# Patient Record
Sex: Male | Born: 1995 | Race: Black or African American | Hispanic: No | Marital: Single | Smoking: Never smoker
Health system: Southern US, Community
[De-identification: ages and names within clinical notes are randomized; demographics above are authoritative.]

## PROBLEM LIST (undated history)

## (undated) DIAGNOSIS — N4830 Priapism, unspecified: Secondary | ICD-10-CM

## (undated) DIAGNOSIS — D571 Sickle-cell disease without crisis: Secondary | ICD-10-CM

## (undated) DIAGNOSIS — J45909 Unspecified asthma, uncomplicated: Secondary | ICD-10-CM

## (undated) HISTORY — PX: SPLENECTOMY: SUR1306

---
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
## 2017-02-23 DEATH — deceased

## 2018-05-03 IMAGING — DX DG LUMBAR SPINE 2-3V
3 series · 3 of 3 positions shown · non-contrast
Comparison: None.

CLINICAL DATA: Sickle cell crisis.  Low back pain.

EXAM:
LUMBAR SPINE - 2-3 VIEW

[l-spine ap]
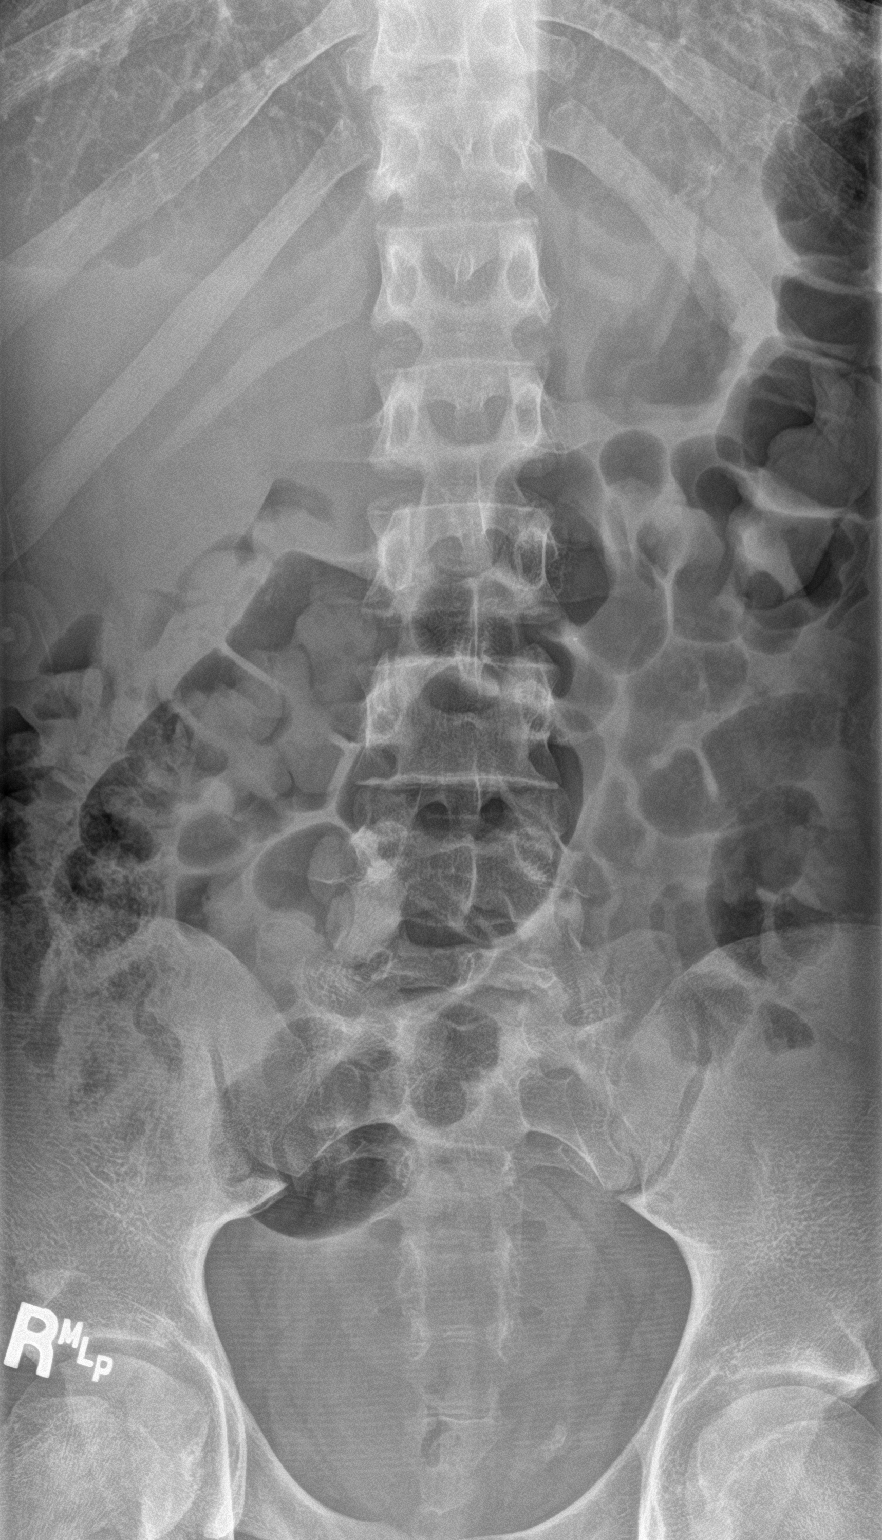

[l-spine lat]
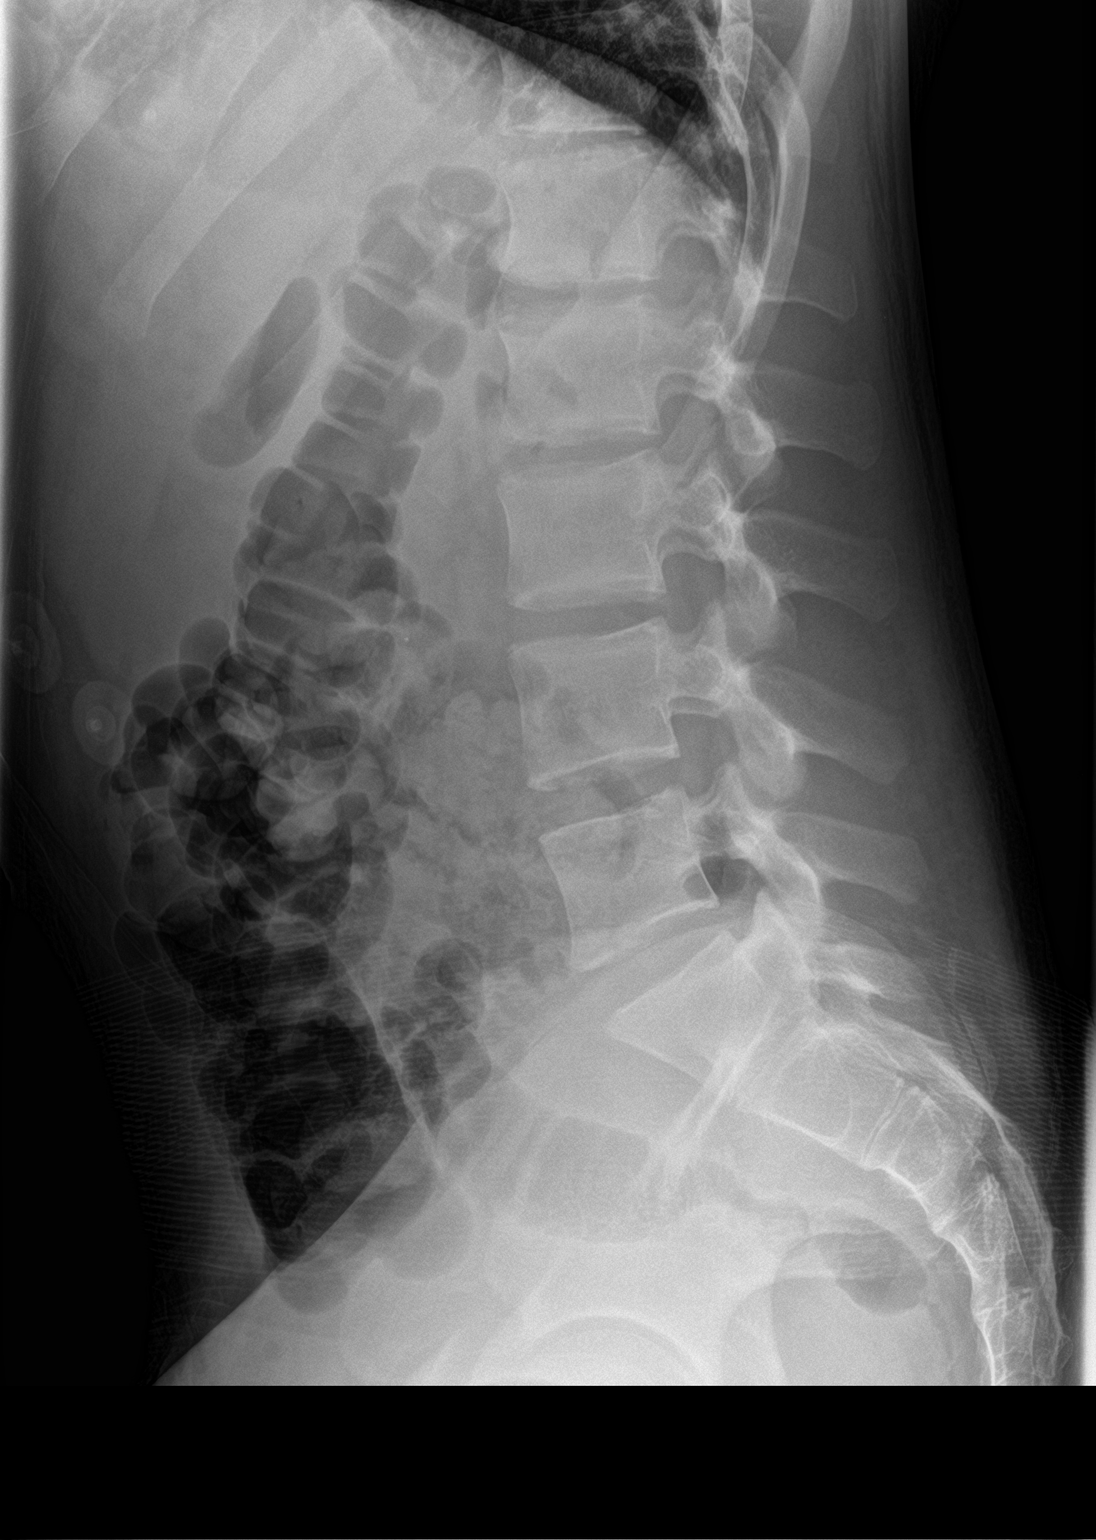

[l-spine spot]
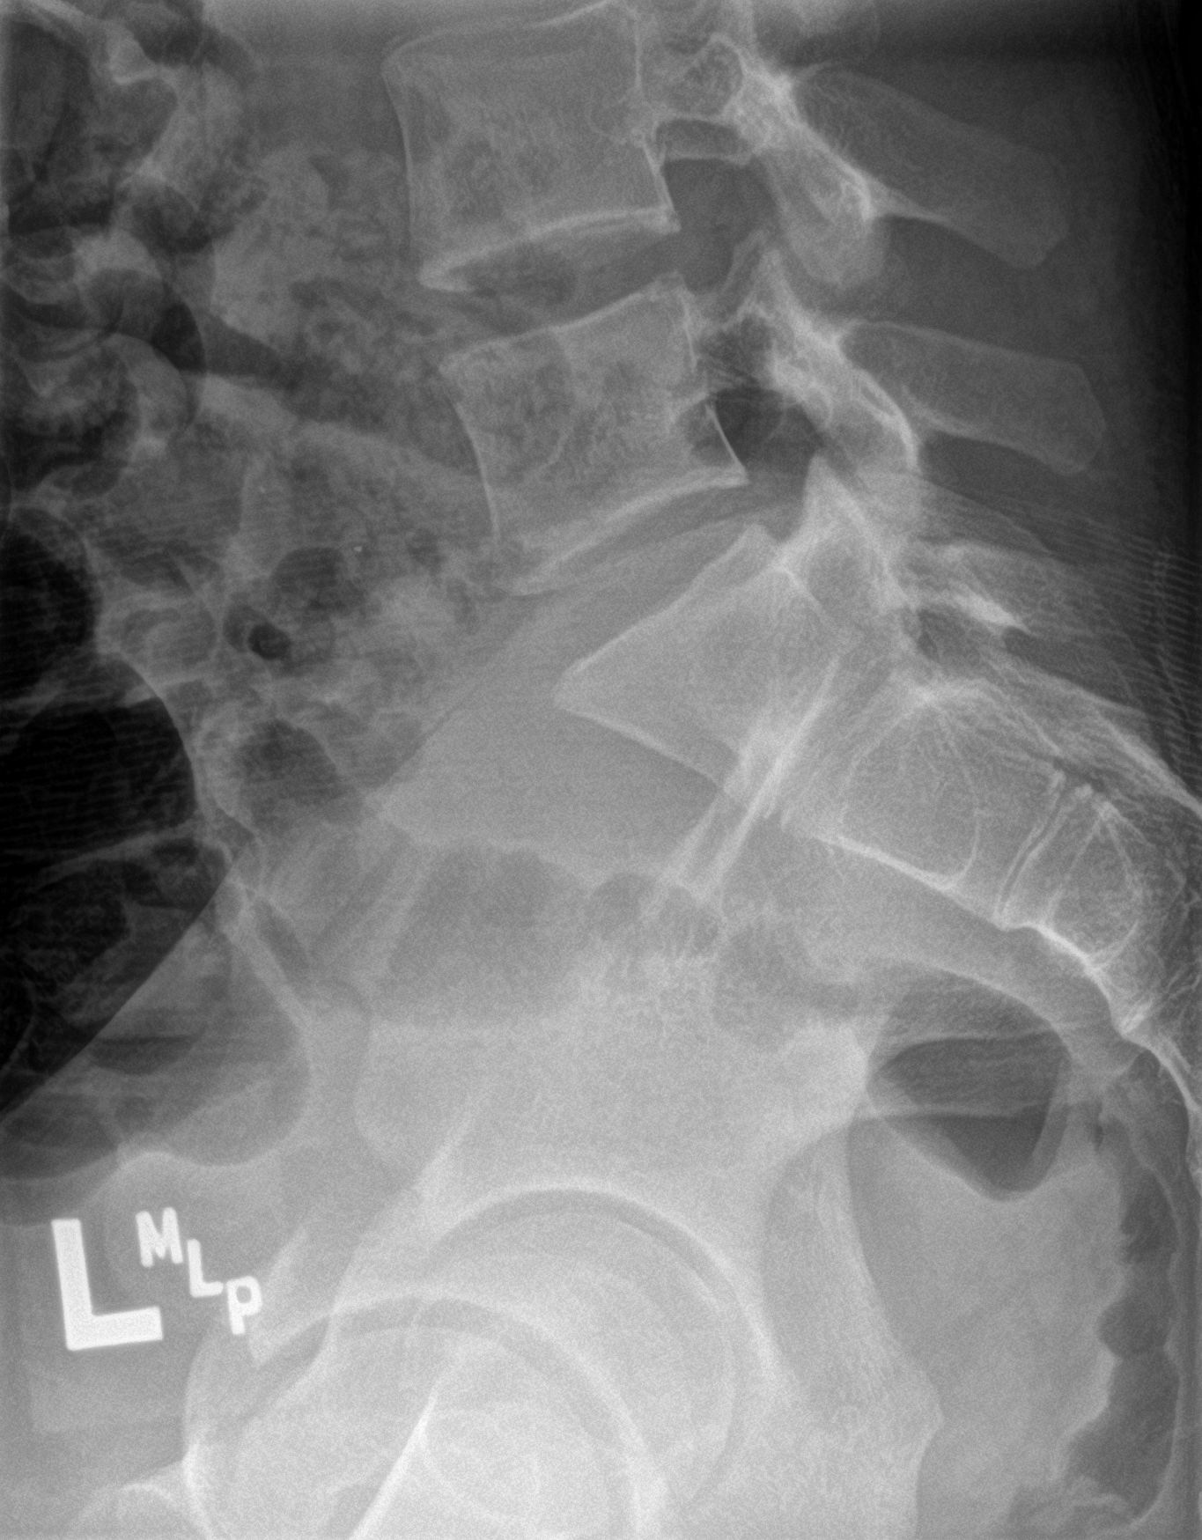

[3 of 3 positions shown; findings below may reference images not displayed]

FINDINGS: There is no evidence of lumbar spine fracture. Alignment is normal.
Intervertebral disc spaces are maintained.
IMPRESSION: Negative.
